# Patient Record
Sex: Female | Born: 2000 | Race: White | Hispanic: Yes | Marital: Single | Smoking: Never smoker
Health system: Southern US, Community
[De-identification: ages and names within clinical notes are randomized; demographics above are authoritative.]

---
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
## 2022-01-14 ENCOUNTER — Emergency Department (HOSPITAL_COMMUNITY)
Admission: EM | Admit: 2022-01-14 | Discharge: 2022-01-14 | Disposition: A | Discharge status: Home / Self Care | Payer: Medicaid Other | Attending: Emergency Medicine | Admitting: Emergency Medicine

## 2022-01-14 ENCOUNTER — Other Ambulatory Visit: Payer: Self-pay

## 2022-01-14 ENCOUNTER — Emergency Department (HOSPITAL_COMMUNITY): Payer: Medicaid Other

## 2022-01-14 DIAGNOSIS — F410 Panic disorder [episodic paroxysmal anxiety] without agoraphobia: Secondary | ICD-10-CM | POA: Diagnosis not present

## 2022-01-14 DIAGNOSIS — R0602 Shortness of breath: Secondary | ICD-10-CM | POA: Diagnosis not present

## 2022-01-14 DIAGNOSIS — R0789 Other chest pain: Secondary | ICD-10-CM | POA: Diagnosis not present

## 2022-01-14 DIAGNOSIS — F419 Anxiety disorder, unspecified: Secondary | ICD-10-CM | POA: Diagnosis present

## 2022-01-14 NOTE — Discharge Instructions (Signed)
Talk to your primary care doctor about medications that can limit the frequency and severity of panic attacks.  Avoid alcohol use.  Get plenty of sleep.  Minimize stressors.  Return to the emergency department for any new or worsening symptoms of concern.

## 2022-01-14 NOTE — ED Triage Notes (Signed)
Pt reports " I am having another anxiety attack and need space with nothing touching me, I get real sob and my chest gets tight for a month now. I have been working extra this past month"  Denies cp and sob at this time  Pt eyes are red and says she has been crying.

## 2022-01-14 NOTE — ED Provider Notes (Signed)
[facility] DEPT Provider Note   CSN: [id number] Arrival date & time: 01/14/22  0901     History  Chief Complaint  Patient presents with   Anxiety    Lynn Ruiz is a 21 y.o. female.   Anxiety Associated symptoms include chest pain and shortness of breath.  Patient presents for intermittent chest pain and shortness of breath.  She has no known chronic medical conditions.  She describes a "bacteria in her chest" that occurred 3 months ago, for which she completed antibiotics.  She describes a central chest pain that she experienced during that time.  Over the past several months, she has had intermittent symptoms where she feels central chest pain, shortness of breath, throat tightness, and anxiousness.  This occurs approximately once every 2 weeks.  She did have an episode this morning.  When these episodes occur, they typically last for 30 minutes.  Patient states that she will go for a walk to help calm down.  Once episode resolves, she is asymptomatic.  Currently, she denies any symptoms.     Home Medications Prior to Admission medications   Medication Sig Start Date End Date Taking? Authorizing Provider  AZASITE 1 % ophthalmic solution Place 1 drop into the left eye 2 (two) times daily. 03/28/19   [provider]  benzonatate (TESSALON) 100 MG capsule Take 1 capsule (100 mg total) by mouth every 8 (eight) hours. 04/15/21   Henderly, Britni A, PA-C  clotrimazole-betamethasone (LOTRISONE) cream Apply 1 application topically 2 (two) times daily. 08/22/19   Landis Martins, DPM  Eyelid Cleansers (STERILID) FOAM Apply 1 a small amount to skin twice a day  Scrub eyelids  with foam , then rinse 03/28/19   [provider]  ibuprofen (ADVIL) 400 MG tablet Take 1 tablet (400 mg total) by mouth every 6 (six) hours as needed. 07/10/18   Little, Wenda Overland, MD  omeprazole (PRILOSEC) 20 MG capsule Take 20 mg by mouth daily.    [provider]  sucralfate (CARAFATE) 1 g tablet Take 1 tablet (1 g total) by mouth 4 (four) times daily. 05/30/21   Lacretia Leigh, MD  Terbinafine HCl 1 % SOLN Spray in between toes at bedtime 08/22/19   Landis Martins, DPM      Allergies    Patient has no known allergies.    Review of Systems   Review of Systems  Respiratory:  Positive for shortness of breath.   Cardiovascular:  Positive for chest pain.  Psychiatric/Behavioral:  The patient is nervous/anxious.   All other systems reviewed and are negative.   Physical Exam Updated Vital Signs BP 127/64   Pulse 73   Temp 97.9 F (36.6 C) (Oral)   Resp 18   Ht 5\' 3"  (1.6 m)   LMP 12/24/2021   SpO2 99%   BMI 35.43 kg/m  Physical Exam Vitals and nursing note reviewed.  Constitutional:      General: She is not in acute distress.    Appearance: Normal appearance. She is well-developed. She is not ill-appearing, toxic-appearing or diaphoretic.  HENT:     Head: Normocephalic and atraumatic.     Right Ear: External ear normal.     Left Ear: External ear normal.     Nose: Nose normal.     Mouth/Throat:     Mouth: Mucous membranes are moist.     Pharynx: Oropharynx is clear.  Eyes:     Extraocular Movements: Extraocular movements intact.  Conjunctiva/sclera: Conjunctivae normal.  Cardiovascular:     Rate and Rhythm: Normal rate and regular rhythm.     Heart sounds: No murmur heard.    No friction rub. No gallop.  Pulmonary:     Effort: Pulmonary effort is normal. No respiratory distress.     Breath sounds: Normal breath sounds. No wheezing or rales.  Chest:     Chest wall: No tenderness.  Abdominal:     General: There is no distension.     Palpations: Abdomen is soft.     Tenderness: There is no abdominal tenderness.  Musculoskeletal:        General: No swelling. Normal range of motion.     Cervical back: Normal range of motion and neck supple.     Right lower leg: No edema.     Left lower leg: No edema.  Skin:     General: Skin is warm and dry.     Capillary Refill: Capillary refill takes less than 2 seconds.     Coloration: Skin is not jaundiced or pale.  Neurological:     General: No focal deficit present.     Mental Status: She is alert and oriented to person, place, and time.     Cranial Nerves: No cranial nerve deficit.     Sensory: No sensory deficit.     Motor: No weakness.     Coordination: Coordination normal.  Psychiatric:        Mood and Affect: Mood normal.        Behavior: Behavior normal.        Thought Content: Thought content normal.        Judgment: Judgment normal.     ED Results / Procedures / Treatments   Labs (all labs ordered are listed, but only abnormal results are displayed) Labs Reviewed - No data to display  EKG None  Radiology DG Chest 2 View  Result Date: 01/14/2022 CLINICAL DATA:  SOB, hx of pna EXAM: CHEST - 2 VIEW COMPARISON:  Chest x-ray May 30, 2021. FINDINGS: The heart size and mediastinal contours are within normal limits. Both lungs are clear. No visible pleural effusions or pneumothorax. No acute osseous abnormality. IMPRESSION: No active cardiopulmonary disease. Electronically Signed   By: Margaretha Sheffield M.D.   On: 01/14/2022 10:54    Procedures Procedures    Medications Ordered in ED Medications - No data to display  ED Course/ Medical Decision Making/ A&P                           Medical Decision Making  Patient is a healthy 21 year old female presenting for transient episodes of chest pain, shortness of breath, throat tightness, and anxiousness.  She had 1 such episode this morning.  Prior to today, her last episode was 2 weeks ago.  When these occur, she goes for a walk to calm down.  They typically last for 30 minutes.  On arrival in the ED, vital signs are normal and patient is well-appearing.  Prior to being bedded in the ED, patient underwent chest x-ray, the results of which were unremarkable.  On exam, patient's breathing is  unlabored.  Heart rate is normal and there are no cardiac rubs or murmurs on auscultation.  Lungs are also clear to auscultation.  Patient describes symptoms consistent with panic attacks.  She denies any alcohol use.  She is interested in starting a medication to help reduce the frequency.  She was  advised to speak with her primary care doctor about this.  In terms of this previous infection she describes, I do not see any record of that in EMR.  Given the short duration of the symptoms and complete resolution, in addition to a normal chest x-ray, I do not suspect current infection or recurrence of infection.  Patient was given reassurance.  She was discharged in good condition.        Final Clinical Impression(s) / ED Diagnoses Final diagnoses:  Panic attack    Rx / DC Orders ED Discharge Orders     None         Gloris Manchester, MD 01/14/22 1141

## 2022-01-14 NOTE — ED Provider Triage Note (Signed)
Emergency Medicine Provider Triage Evaluation Note  Lynn Ruiz , a 21 y.o. female  was evaluated in triage.  Pt complains of having an anxiety attack.  Pt reports she recently had a bacterial infection in her lungs.   Review of Systems  Positive: Cough anxiety Negative: fever  Physical Exam  BP 127/64   Pulse 73   Temp 97.9 F (36.6 C) (Oral)   Resp 18   Ht 5\' 3"  (1.6 m)   LMP 12/24/2021   SpO2 99%   BMI 35.43 kg/m  Gen:   Awake, no distress   Resp:  Normal effort  MSK:   Moves extremities without difficulty  Other:    Medical Decision Making  Medically screening exam initiated at 9:35 AM.  Appropriate orders placed.  Camila Maita was informed that the remainder of the evaluation will be completed by another provider, this initial triage assessment does not replace that evaluation, and the importance of remaining in the ED until their evaluation is complete.     Foye Deer, Elson Areas 01/14/22 [phone]

## 2022-01-19 ENCOUNTER — Emergency Department (HOSPITAL_COMMUNITY)
Admission: EM | Admit: 2022-01-19 | Discharge: 2022-01-20 | Disposition: A | Discharge status: Home / Self Care | Payer: Medicaid Other | Attending: Emergency Medicine | Admitting: Emergency Medicine

## 2022-01-19 ENCOUNTER — Other Ambulatory Visit: Payer: Self-pay

## 2022-01-19 ENCOUNTER — Emergency Department (HOSPITAL_COMMUNITY): Payer: Medicaid Other

## 2022-01-19 DIAGNOSIS — S39012A Strain of muscle, fascia and tendon of lower back, initial encounter: Secondary | ICD-10-CM | POA: Insufficient documentation

## 2022-01-19 DIAGNOSIS — Y92410 Unspecified street and highway as the place of occurrence of the external cause: Secondary | ICD-10-CM | POA: Diagnosis not present

## 2022-01-19 DIAGNOSIS — S161XXA Strain of muscle, fascia and tendon at neck level, initial encounter: Secondary | ICD-10-CM | POA: Insufficient documentation

## 2022-01-19 DIAGNOSIS — Z743 Need for continuous supervision: Secondary | ICD-10-CM | POA: Diagnosis not present

## 2022-01-19 DIAGNOSIS — T1490XA Injury, unspecified, initial encounter: Secondary | ICD-10-CM | POA: Diagnosis not present

## 2022-01-19 DIAGNOSIS — R531 Weakness: Secondary | ICD-10-CM | POA: Diagnosis not present

## 2022-01-19 DIAGNOSIS — R079 Chest pain, unspecified: Secondary | ICD-10-CM | POA: Diagnosis not present

## 2022-01-19 DIAGNOSIS — I959 Hypotension, unspecified: Secondary | ICD-10-CM | POA: Diagnosis not present

## 2022-01-19 DIAGNOSIS — S199XXA Unspecified injury of neck, initial encounter: Secondary | ICD-10-CM | POA: Diagnosis present

## 2022-01-19 LAB — CBC
HCT: 40.1 % (ref 36.0–46.0)
Hemoglobin: 12.8 g/dL (ref 12.0–15.0)
MCH: 27.5 pg (ref 26.0–34.0)
MCHC: 31.9 g/dL (ref 30.0–36.0)
MCV: 86.1 fL (ref 80.0–100.0)
Platelets: 312 10*3/uL (ref 150–400)
RBC: 4.66 MIL/uL (ref 3.87–5.11)
RDW: 13.5 % (ref 11.5–15.5)
WBC: 8.4 10*3/uL (ref 4.0–10.5)
nRBC: 0 % (ref 0.0–0.2)

## 2022-01-19 LAB — URINALYSIS, ROUTINE W REFLEX MICROSCOPIC
Bilirubin Urine: NEGATIVE
Glucose, UA: NEGATIVE mg/dL
Ketones, ur: NEGATIVE mg/dL
Leukocytes,Ua: NEGATIVE
Nitrite: NEGATIVE
Protein, ur: NEGATIVE mg/dL
Specific Gravity, Urine: 1.025 (ref 1.005–1.030)
pH: 6.5 (ref 5.0–8.0)

## 2022-01-19 LAB — COMPREHENSIVE METABOLIC PANEL
ALT: 66 U/L — ABNORMAL HIGH (ref 0–44)
AST: 42 U/L — ABNORMAL HIGH (ref 15–41)
Albumin: 4.1 g/dL (ref 3.5–5.0)
Alkaline Phosphatase: 59 U/L (ref 38–126)
Anion gap: 11 (ref 5–15)
BUN: 11 mg/dL (ref 6–20)
CO2: 23 mmol/L (ref 22–32)
Calcium: 9.2 mg/dL (ref 8.9–10.3)
Chloride: 105 mmol/L (ref 98–111)
Creatinine, Ser: 0.66 mg/dL (ref 0.44–1.00)
GFR, Estimated: >60 mL/min (ref >60)
Glucose, Bld: 94 mg/dL (ref 70–99)
Potassium: 4.3 mmol/L (ref 3.5–5.1)
Sodium: 139 mmol/L (ref 135–145)
Total Bilirubin: 0.4 mg/dL (ref 0.3–1.2)
Total Protein: 7.5 g/dL (ref 6.5–8.1)

## 2022-01-19 LAB — URINALYSIS, MICROSCOPIC (REFLEX)

## 2022-01-19 LAB — I-STAT CHEM 8, ED
BUN: 12 mg/dL (ref 6–20)
Calcium, Ion: 1.18 mmol/L (ref 1.15–1.40)
Chloride: 105 mmol/L (ref 98–111)
Creatinine, Ser: 0.6 mg/dL (ref 0.44–1.00)
Glucose, Bld: 97 mg/dL (ref 70–99)
HCT: 38 % (ref 36.0–46.0)
Hemoglobin: 12.9 g/dL (ref 12.0–15.0)
Potassium: 3.9 mmol/L (ref 3.5–5.1)
Sodium: 140 mmol/L (ref 135–145)
TCO2: 24 mmol/L (ref 22–32)

## 2022-01-19 LAB — I-STAT BETA HCG BLOOD, ED (MC, WL, AP ONLY): I-stat hCG, quantitative: <5 m[IU]/mL (ref <5)

## 2022-01-19 MED ORDER — HYDROCODONE-ACETAMINOPHEN 5-325 MG PO TABS
1.0000 | ORAL_TABLET | Freq: Once | ORAL | Status: AC
  Administered 2022-01-19: 1 via ORAL
  Filled 2022-01-19: qty 1

## 2022-01-19 MED ORDER — ONDANSETRON 4 MG PO TBDP
4.0000 mg | ORAL_TABLET | Freq: Once | ORAL | Status: AC
  Administered 2022-01-19: 4 mg via ORAL
  Filled 2022-01-19: qty 1

## 2022-01-19 MED ORDER — IOHEXOL 350 MG/ML SOLN
75.0000 mL | Freq: Once | INTRAVENOUS | Status: AC | PRN
  Administered 2022-01-20: 75 mL via INTRAVENOUS
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
## 2022-01-20 DIAGNOSIS — F321 Major depressive disorder, single episode, moderate: Secondary | ICD-10-CM | POA: Diagnosis not present

## 2022-01-20 DIAGNOSIS — F39 Unspecified mood [affective] disorder: Secondary | ICD-10-CM | POA: Diagnosis not present

## 2022-01-20 DIAGNOSIS — R631 Polydipsia: Secondary | ICD-10-CM | POA: Diagnosis not present

## 2022-01-20 DIAGNOSIS — E559 Vitamin D deficiency, unspecified: Secondary | ICD-10-CM | POA: Diagnosis not present

## 2022-01-20 DIAGNOSIS — F411 Generalized anxiety disorder: Secondary | ICD-10-CM | POA: Diagnosis not present

## 2022-01-20 MED ORDER — LIDOCAINE 4 % EX PTCH: 1.0000 | MEDICATED_PATCH | CUTANEOUS | 0 refills | Status: AC

## 2022-01-20 MED ORDER — KETOROLAC TROMETHAMINE 60 MG/2ML IM SOLN
30.0000 mg | Freq: Once | INTRAMUSCULAR | Status: AC
  Administered 2022-01-20: 30 mg via INTRAMUSCULAR
  Filled 2022-01-20: qty 2
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
## 2022-03-05 ENCOUNTER — Other Ambulatory Visit: Payer: Self-pay

## 2022-03-05 ENCOUNTER — Emergency Department (HOSPITAL_COMMUNITY): Admission: EM | Admit: 2022-03-05 | Payer: No Typology Code available for payment source | Source: Home / Self Care

## 2022-03-05 ENCOUNTER — Encounter (HOSPITAL_COMMUNITY): Payer: Self-pay

## 2022-03-05 ENCOUNTER — Emergency Department (HOSPITAL_COMMUNITY): Payer: Medicaid Other

## 2022-03-05 ENCOUNTER — Emergency Department (HOSPITAL_COMMUNITY)
Admission: EM | Admit: 2022-03-05 | Discharge: 2022-03-05 | Disposition: A | Discharge status: Home / Self Care | Payer: Medicaid Other | Attending: Emergency Medicine | Admitting: Emergency Medicine

## 2022-03-05 DIAGNOSIS — K5900 Constipation, unspecified: Secondary | ICD-10-CM

## 2022-03-05 DIAGNOSIS — K625 Hemorrhage of anus and rectum: Secondary | ICD-10-CM

## 2022-03-05 DIAGNOSIS — N9489 Other specified conditions associated with female genital organs and menstrual cycle: Secondary | ICD-10-CM | POA: Diagnosis not present

## 2022-03-05 DIAGNOSIS — R109 Unspecified abdominal pain: Secondary | ICD-10-CM | POA: Diagnosis not present

## 2022-03-05 DIAGNOSIS — K649 Unspecified hemorrhoids: Secondary | ICD-10-CM

## 2022-03-05 LAB — URINALYSIS, ROUTINE W REFLEX MICROSCOPIC
Bilirubin Urine: NEGATIVE
Glucose, UA: NEGATIVE mg/dL
Ketones, ur: NEGATIVE mg/dL
Nitrite: NEGATIVE
Protein, ur: NEGATIVE mg/dL
Specific Gravity, Urine: 1.024 (ref 1.005–1.030)
pH: 5 (ref 5.0–8.0)

## 2022-03-05 LAB — LIPASE, BLOOD: Lipase: 31 U/L (ref 11–51)

## 2022-03-05 LAB — I-STAT BETA HCG BLOOD, ED (MC, WL, AP ONLY): I-stat hCG, quantitative: <5 m[IU]/mL (ref <5)

## 2022-03-05 LAB — COMPREHENSIVE METABOLIC PANEL
ALT: 76 U/L — ABNORMAL HIGH (ref 0–44)
AST: 37 U/L (ref 15–41)
Albumin: 4 g/dL (ref 3.5–5.0)
Alkaline Phosphatase: 59 U/L (ref 38–126)
Anion gap: 8 (ref 5–15)
BUN: 13 mg/dL (ref 6–20)
CO2: 26 mmol/L (ref 22–32)
Calcium: 8.9 mg/dL (ref 8.9–10.3)
Chloride: 104 mmol/L (ref 98–111)
Creatinine, Ser: 0.61 mg/dL (ref 0.44–1.00)
GFR, Estimated: >60 mL/min (ref >60)
Glucose, Bld: 104 mg/dL — ABNORMAL HIGH (ref 70–99)
Potassium: 3.6 mmol/L (ref 3.5–5.1)
Sodium: 138 mmol/L (ref 135–145)
Total Bilirubin: 0.3 mg/dL (ref 0.3–1.2)
Total Protein: 7.9 g/dL (ref 6.5–8.1)

## 2022-03-05 LAB — CBC
HCT: 40.8 % (ref 36.0–46.0)
Hemoglobin: 12.9 g/dL (ref 12.0–15.0)
MCH: 27.2 pg (ref 26.0–34.0)
MCHC: 31.6 g/dL (ref 30.0–36.0)
MCV: 86.1 fL (ref 80.0–100.0)
Platelets: 304 10*3/uL (ref 150–400)
RBC: 4.74 MIL/uL (ref 3.87–5.11)
RDW: 13.5 % (ref 11.5–15.5)
WBC: 7.2 10*3/uL (ref 4.0–10.5)
nRBC: 0 % (ref 0.0–0.2)

## 2022-03-05 LAB — POC OCCULT BLOOD, ED: Fecal Occult Bld: POSITIVE — AB

## 2022-03-05 LAB — TYPE AND SCREEN
ABO/RH(D): O POS
Antibody Screen: NEGATIVE

## 2022-03-05 MED ORDER — POLYETHYLENE GLYCOL 3350 17 G PO PACK
17.0000 g | PACK | Freq: Every day | ORAL | Status: DC
  Administered 2022-03-05: 17 g via ORAL
  Filled 2022-03-05: qty 1

## 2022-03-05 MED ORDER — POLYETHYLENE GLYCOL 3350 17 G PO PACK: 17.0000 g | PACK | Freq: Every day | ORAL | 0 refills | Status: AC

## 2022-03-05 NOTE — ED Notes (Signed)
Per Jerene Pitch, [place], the patient is to be discharged and informed to follow up with primary care doctor regarding her toes.
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
## 2022-03-05 NOTE — ED Notes (Signed)
Pt A&OX4 ambulatory with independent steady gait. Pt verbalized understanding of d/c instructions, prescription and follow up care. 

## 2022-03-05 NOTE — ED Notes (Signed)
Pt reports she has infected toe nails from a previous visit as a nail salon. She is requesting the PA to assess them to see if she needs medication. Brooke, [place] informed and at bedside.
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
## 2022-03-05 NOTE — Discharge Instructions (Signed)
Please follow-up with your primary care doctor, please increase your fluids in your diet, and veggies.  If you have large amounts of blood in your stool, begin become dizzy, or have severe abdominal pain please return to the ER.  Please take MiraLAX daily for the next 7 days at least and then as needed for your constipation.

## 2022-03-06 ENCOUNTER — Telehealth: Payer: Self-pay | Admitting: Podiatry

## 2022-03-06 NOTE — Telephone Encounter (Signed)
Pt had scheduled an appt thru mychart as a new pt and I called and left message for pt that she is not a new pt with our office and I had to change the time of her appt to 945am on 1.23 and to call to confirm.Lynn KitchenMarland Ruiz

## 2022-03-17 ENCOUNTER — Ambulatory Visit (INDEPENDENT_AMBULATORY_CARE_PROVIDER_SITE_OTHER): Payer: Medicaid Other | Admitting: Podiatry

## 2022-03-17 ENCOUNTER — Encounter: Payer: Self-pay | Admitting: Podiatry

## 2022-03-17 VITALS — BP 100/66 | HR 80

## 2022-03-17 DIAGNOSIS — L600 Ingrowing nail: Secondary | ICD-10-CM

## 2022-03-17 NOTE — Patient Instructions (Signed)

## 2022-03-17 NOTE — Progress Notes (Signed)
  Subjective:  Patient ID: Lynn Ruiz, female    DOB: [birth year],   MRN: [id number]  Chief Complaint  Patient presents with   Ingrown Toenail    Patient is here for bilateral ingrown toe nail great toe, she states that they both hurt and she recently went to have  a pedicure done and she thinks that is what caused the infection in both great toes.    22 y.o. female presents for concern of bilateral ingrown nails. Relates she has been dealing with these for years. She has had them removed before but not permanently. Relates they keep getting reinfected from the salon. Relates pain. Deneis any treatments.  . Denies any other pedal complaints. Denies n/v/f/c.   History reviewed. No pertinent past medical history.  Objective:  Physical Exam: Vascular: DP/PT pulses 2/4 bilateral. CFT <3 seconds. Normal hair growth on digits. No edema.  Skin. No lacerations or abrasions bilateral feet. Bilateral hallux nails incurvated on bilateral borders. Erythema and edema and hypergranular tissue noted to lateral borders bilateral.  Musculoskeletal: MMT 5/5 bilateral lower extremities in DF, PF, Inversion and Eversion. Deceased ROM in DF of ankle joint.  Neurological: Sensation intact to light touch.   Assessment:   1. Ingrown left greater toenail   2. Ingrown right greater toenail      Plan:  Patient was evaluated and treated and all questions answered. Patient requesting removal of ingrown nail today. Procedure below.  Discussed procedure and post procedure care and patient expressed understanding.  Will follow-up in 2 weeks for nail check or sooner if any problems arise.    Procedure:  Procedure: partial Nail Avulsion of bilaterally hallux bilateral nail border.  Surgeon: Lorenda Peck, DPM  Pre-op Dx: Ingrown toenail with and without infection Post-op: Same  Place of Surgery: Office exam room.  Indications for surgery: Painful and ingrown toenail.    The patient is requesting  removal of nail with chemical matrixectomy. Risks and complications were discussed with the patient for which they understand and written consent was obtained. Under sterile conditions a total of 3 mL of  1% lidocaine plain was infiltrated in a hallux block fashion. Once anesthetized, the skin was prepped in sterile fashion. A tourniquet was then applied. Next the bilateral aspect of hallux nail border was then sharply excised making sure to remove the entire offending nail border.  Next phenol was then applied under standard conditions and copiously irrigated. Silvadene was applied. A dry sterile dressing was applied. After application of the dressing the tourniquet was removed and there is found to be an immediate capillary refill time to the digit. The patient tolerated the procedure well without any complications. Post procedure instructions were discussed the patient for which he verbally understood. Follow-up in two weeks for nail check or sooner if any problems are to arise. Discussed signs/symptoms of infection and directed to call the office immediately should any occur or go directly to the emergency room. In the meantime, encouraged to call the office with any questions, concerns, changes symptoms.   Lorenda Peck, DPM

## 2022-03-23 ENCOUNTER — Emergency Department (HOSPITAL_COMMUNITY): Payer: Medicaid Other

## 2022-03-23 ENCOUNTER — Other Ambulatory Visit: Payer: Self-pay

## 2022-03-23 ENCOUNTER — Emergency Department (HOSPITAL_COMMUNITY)
Admission: EM | Admit: 2022-03-23 | Discharge: 2022-03-23 | Disposition: A | Discharge status: Home / Self Care | Payer: Medicaid Other | Attending: Emergency Medicine | Admitting: Emergency Medicine

## 2022-03-23 DIAGNOSIS — Z1152 Encounter for screening for COVID-19: Secondary | ICD-10-CM | POA: Diagnosis not present

## 2022-03-23 DIAGNOSIS — R059 Cough, unspecified: Secondary | ICD-10-CM | POA: Diagnosis present

## 2022-03-23 DIAGNOSIS — J101 Influenza due to other identified influenza virus with other respiratory manifestations: Secondary | ICD-10-CM | POA: Insufficient documentation

## 2022-03-23 LAB — RESP PANEL BY RT-PCR (RSV, FLU A&B, COVID)  RVPGX2
Influenza A by PCR: POSITIVE — AB
Influenza B by PCR: NEGATIVE
Resp Syncytial Virus by PCR: NEGATIVE
SARS Coronavirus 2 by RT PCR: NEGATIVE

## 2022-03-23 MED ORDER — BENZONATATE 100 MG PO CAPS: 100.0000 mg | ORAL_CAPSULE | Freq: Three times a day (TID) | ORAL | 0 refills | Status: AC | PRN

## 2022-03-23 NOTE — ED Triage Notes (Signed)
Pt c/o coughing x 1 month and states fever last night with leg numbness and pain.

## 2022-03-23 NOTE — ED Provider Notes (Signed)
[facility] Provider Note   CSN: [id number] Arrival date & time: 03/23/22  1517     History  Chief Complaint  Patient presents with   Cough   Leg Pain    Lynn Ruiz is a 22 y.o. female with no documented medical history.  The patient presents to the ED for evaluation of flulike symptoms, cough, fever, leg pain.  Patient reports that for the last 1 month she has had a cough.  Patient reports that yesterday this cough worsened and she also developed a fever.  Patient also goes on to report that she had 1 episode where she had numbness in her legs bilaterally however this resolved and now they just "feel weak".  Patient denies back pain, denies dysuria, flank pain.  Patient denies trauma to her back.  Patient denies nausea, vomiting or diarrhea.  Patient denies chest pain or shortness of breath.   Cough Associated symptoms: chills, fever and myalgias   Associated symptoms: no chest pain, no shortness of breath and no sore throat   Leg Pain Associated symptoms: fever        Home Medications Prior to Admission medications   Medication Sig Start Date End Date Taking? Authorizing Provider  benzonatate (TESSALON) 100 MG capsule Take 1 capsule (100 mg total) by mouth every 8 (eight) hours as needed for cough. 03/23/22  Yes Azucena Cecil, PA-C  AZASITE 1 % ophthalmic solution Place 1 drop into the left eye 2 (two) times daily. 03/28/19   [provider]  clotrimazole-betamethasone (LOTRISONE) cream Apply 1 application topically 2 (two) times daily. 08/22/19   Landis Martins, DPM  Eyelid Cleansers (STERILID) FOAM Apply 1 a small amount to skin twice a day  Scrub eyelids  with foam , then rinse 03/28/19   [provider]  ibuprofen (ADVIL) 400 MG tablet Take 1 tablet (400 mg total) by mouth every 6 (six) hours as needed. 07/10/18   Little, Wenda Overland, MD  lidocaine (LIDOCAINE PAIN RELIEF) 4 % Place 1 patch onto the  skin daily. 01/20/22   Regan Lemming, MD  omeprazole (PRILOSEC) 20 MG capsule Take 20 mg by mouth daily.    [provider]  polyethylene glycol (MIRALAX) 17 g packet Take 17 g by mouth daily. 03/05/22   Small, Brooke L, PA  sucralfate (CARAFATE) 1 g tablet Take 1 tablet (1 g total) by mouth 4 (four) times daily. 05/30/21   Lacretia Leigh, MD  Terbinafine HCl 1 % SOLN Spray in between toes at bedtime 08/22/19   Landis Martins, DPM      Allergies    Patient has no known allergies.    Review of Systems   Review of Systems  Constitutional:  Positive for chills and fever.  HENT:  Negative for sore throat.   Respiratory:  Positive for cough. Negative for shortness of breath.   Cardiovascular:  Negative for chest pain.  Gastrointestinal:  Negative for diarrhea, nausea and vomiting.  Musculoskeletal:  Positive for myalgias.  All other systems reviewed and are negative.   Physical Exam Updated Vital Signs BP 121/82   Pulse 100   Temp 97.8 F (36.6 C) (Oral)   Resp 18   LMP 03/23/2022   SpO2 100%  Physical Exam Vitals and nursing note reviewed.  Constitutional:      General: She is not in acute distress.    Appearance: She is well-developed.  HENT:     Head: Normocephalic and atraumatic.  Mouth/Throat:     Mouth: Mucous membranes are moist.     Pharynx: Oropharynx is clear. Posterior oropharyngeal erythema present. No oropharyngeal exudate.  Eyes:     Conjunctiva/sclera: Conjunctivae normal.  Cardiovascular:     Rate and Rhythm: Normal rate and regular rhythm.     Heart sounds: No murmur heard. Pulmonary:     Effort: Pulmonary effort is normal. No respiratory distress.     Breath sounds: Normal breath sounds.  Abdominal:     Palpations: Abdomen is soft.     Tenderness: There is no abdominal tenderness.  Musculoskeletal:        General: No swelling.     Cervical back: Neck supple.  Skin:    General: Skin is warm and dry.     Capillary Refill: Capillary refill  takes less than 2 seconds.  Neurological:     General: No focal deficit present.     Mental Status: She is alert and oriented to person, place, and time.     GCS: GCS eye subscore is 4. GCS verbal subscore is 5. GCS motor subscore is 6.     Cranial Nerves: Cranial nerves 2-12 are intact. No cranial nerve deficit.     Sensory: Sensation is intact. No sensory deficit.     Motor: Motor function is intact. No weakness.     Coordination: Coordination is intact. Heel to [facility] Test normal.     Gait: Gait is intact. Gait normal.     Comments: 5 out of 5 strength bilateral lower extremities.  Patient shows steady gait.  Psychiatric:        Mood and Affect: Mood normal.     ED Results / Procedures / Treatments   Labs (all labs ordered are listed, but only abnormal results are displayed) Labs Reviewed  RESP PANEL BY RT-PCR (RSV, FLU A&B, COVID)  RVPGX2 - Abnormal; Notable for the following components:      Result Value   Influenza A by PCR POSITIVE (*)    All other components within normal limits    EKG None  Radiology DG Chest 2 View  Result Date: 03/23/2022 CLINICAL DATA:  Cough for 1 month, worsening yesterday. EXAM: CHEST - 2 VIEW COMPARISON:  January 14, 2022 FINDINGS: The heart size and mediastinal contours are within normal limits. Both lungs are clear. The visualized skeletal structures are unremarkable. IMPRESSION: No active cardiopulmonary disease. Electronically Signed   By: Sherian Rein M.D.   On: 03/23/2022 18:15    Procedures Procedures    Medications Ordered in ED Medications - No data to display  ED Course/ Medical Decision Making/ A&P  Medical Decision Making  22 year old female presents to ED for evaluation of URI symptoms.  Please see HPI for further details.  On examination the patient is afebrile, nontachycardic.  Patient lung sounds clear bilaterally, she is not hypoxic on room air.  Patient abdomen soft and compressible throughout.  Posterior oropharynx is  erythematous without exudate.  Uvula midline, handling secretions appropriately.  Viral testing positive for influenza A.  Chest x-ray shows no consolidations or effusions.  Patient shows steady gait.  Normal neurological examination.  5 out of 5 strength bilateral lower extremities.  Unfortunately patient outside of window for antiviral medication initiation.  Patient will be sent home with Jerilynn Som, advised to follow-up with PCP.  Patient provided return precautions and she voiced understanding.  Patient advised to treat symptoms at home conservatively.  Patient will be written out of work.  Patient discharged  in stable condition.   Final Clinical Impression(s) / ED Diagnoses Final diagnoses:  Influenza A    Rx / DC Orders ED Discharge Orders          Ordered    benzonatate (TESSALON) 100 MG capsule  Every 8 hours PRN        03/23/22 2046              Lawana Chambers 03/23/22 2047    Fransico Meadow, MD 03/26/22 [phone]

## 2022-03-23 NOTE — ED Provider Triage Note (Signed)
Emergency Medicine Provider Triage Evaluation Note  Hyacinth Marcelli , a 22 y.o. female  was evaluated in triage.  Pt complains of multiple symptoms.  She reports having a cough for approximately 1 month.  She denies associated fevers, runny nose, sore throat.  She denies history of asthma, allergy symptoms, GERD.  She is not on any chronic medications.  Yesterday she developed more of a runny nose and worsening cough.  She had a fever at home.  She also reports an episode where she had numbness in her legs bilaterally.  This has resolved but now her legs just "feel weak".  No associated back pain.  No urinary symptoms.  Review of Systems  Positive: Cough, fever Negative: Back pain  Physical Exam  BP 121/82   Pulse 100   Temp 97.8 F (36.6 C) (Oral)   Resp 18   SpO2 100%  Gen:   Awake, no distress   Resp:  Normal effort  MSK:   Moves extremities without difficulty  Other:    Medical Decision Making  Medically screening exam initiated at 3:31 PM.  Appropriate orders placed.  Marcheta Horsey was informed that the remainder of the evaluation will be completed by another provider, this initial triage assessment does not replace that evaluation, and the importance of remaining in the ED until their evaluation is complete.     Carlisle Cater, PA-C 03/23/22 1533

## 2022-03-23 NOTE — ED Notes (Signed)
Pt advises being unable to provide urine sample at this time, will monitor.

## 2022-03-23 NOTE — Discharge Instructions (Addendum)
Return to the ED with any new or worsening signs or symptoms Please follow-up with PCP for ongoing management Please see work note Please read attached guide concerning influenza Please fill prescription for anticough medication Please treat symptoms conservatively.  You will may take ibuprofen for body aches and chills, Tylenol for headaches and fevers.  You may take Zicam to help shorten duration of symptoms.  Please eat low-fat high-protein diet.  Please push fluids to include Gatorade, body armor or Pedialyte

## 2022-03-30 ENCOUNTER — Ambulatory Visit: Payer: Medicaid Other | Admitting: Podiatry

## 2022-10-05 ENCOUNTER — Ambulatory Visit (INDEPENDENT_AMBULATORY_CARE_PROVIDER_SITE_OTHER): Payer: BLUE CROSS/BLUE SHIELD | Admitting: Podiatry

## 2022-10-05 DIAGNOSIS — Z91199 Patient's noncompliance with other medical treatment and regimen due to unspecified reason: Secondary | ICD-10-CM

## 2022-10-05 NOTE — Progress Notes (Signed)
No show

## 2022-10-07 ENCOUNTER — Telehealth: Payer: Self-pay | Admitting: Podiatry

## 2022-10-07 NOTE — Telephone Encounter (Signed)
Called pt and left message to let her know that her insurance is out of network.

## 2022-10-13 ENCOUNTER — Ambulatory Visit (INDEPENDENT_AMBULATORY_CARE_PROVIDER_SITE_OTHER): Payer: BLUE CROSS/BLUE SHIELD | Admitting: Podiatry

## 2022-10-13 DIAGNOSIS — Z91199 Patient's noncompliance with other medical treatment and regimen due to unspecified reason: Secondary | ICD-10-CM

## 2022-10-13 NOTE — Progress Notes (Signed)
No show

## 2023-06-08 IMAGING — US US ABDOMEN LIMITED
1 series · 14 of 25 positions shown · non-contrast
Comparison: None.

CLINICAL DATA: Right upper quadrant pain

EXAM:
ULTRASOUND ABDOMEN LIMITED RIGHT UPPER QUADRANT

[Series 1: us abdomen limited · 14 of 50 slices shown]
[im 1/50]
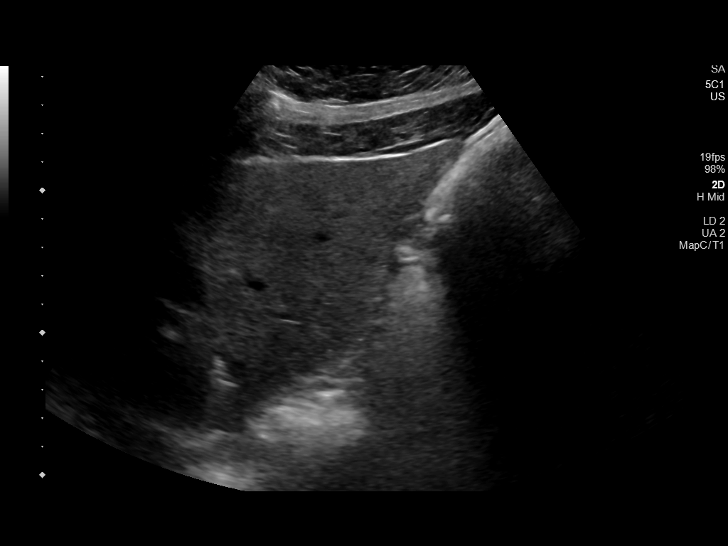
[im 5/50]
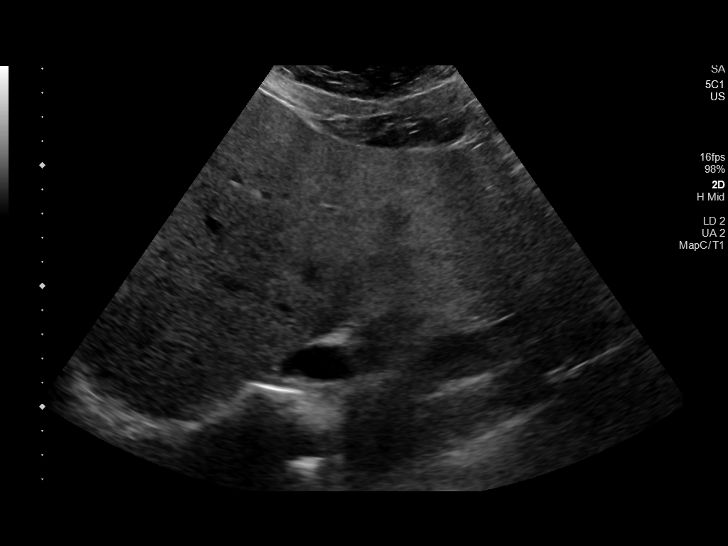
[im 9/50]
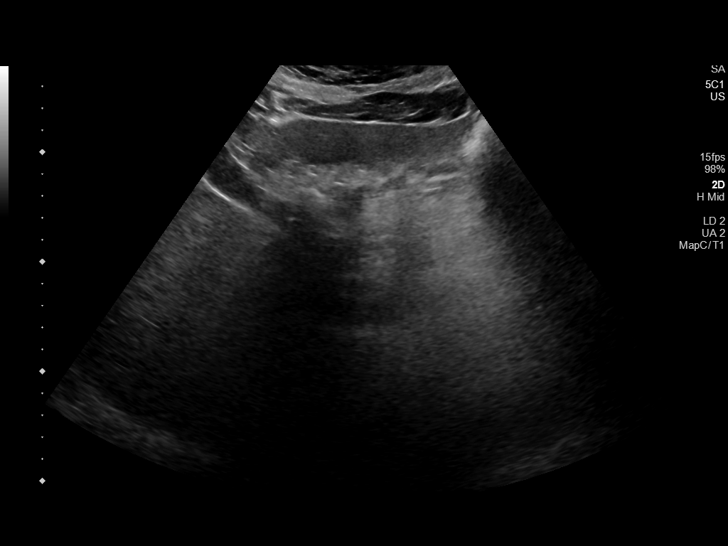
[im 13/50]
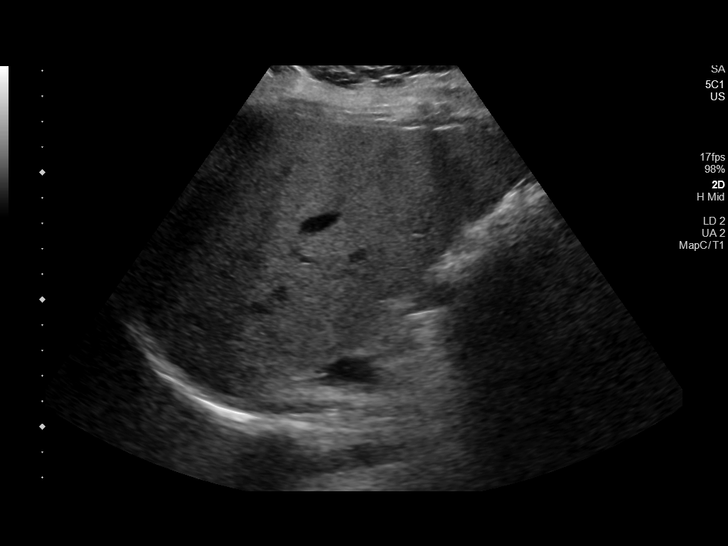
[im 17/50]
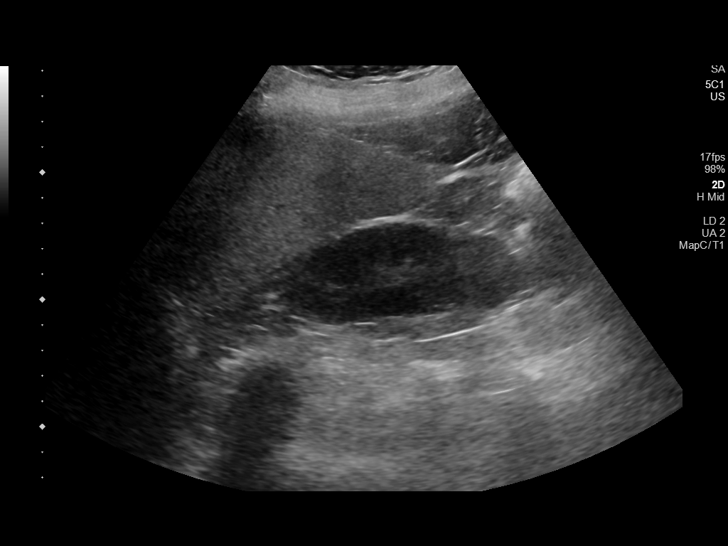
[im 19/50]
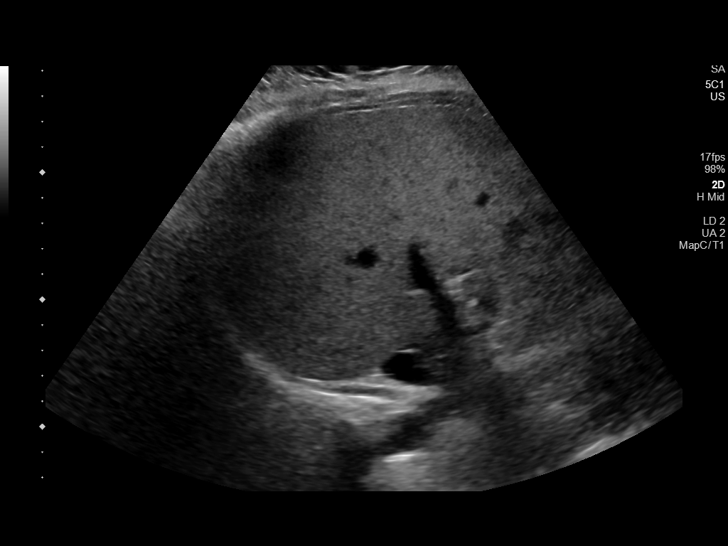
[im 23/50]
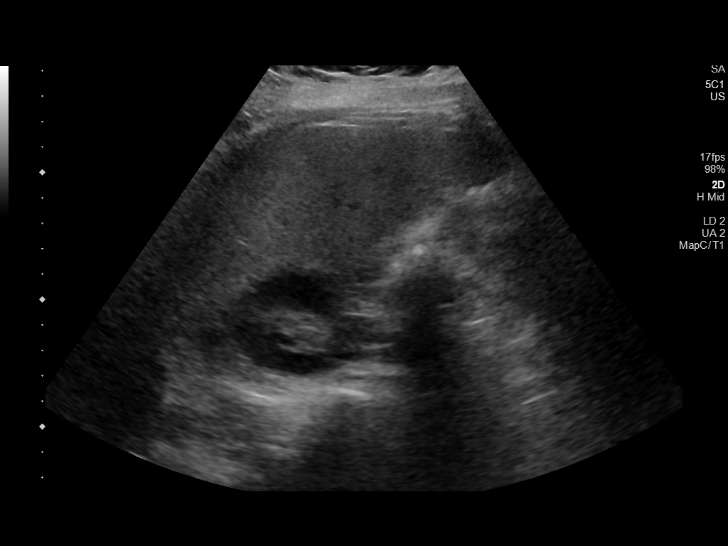
[im 27/50]
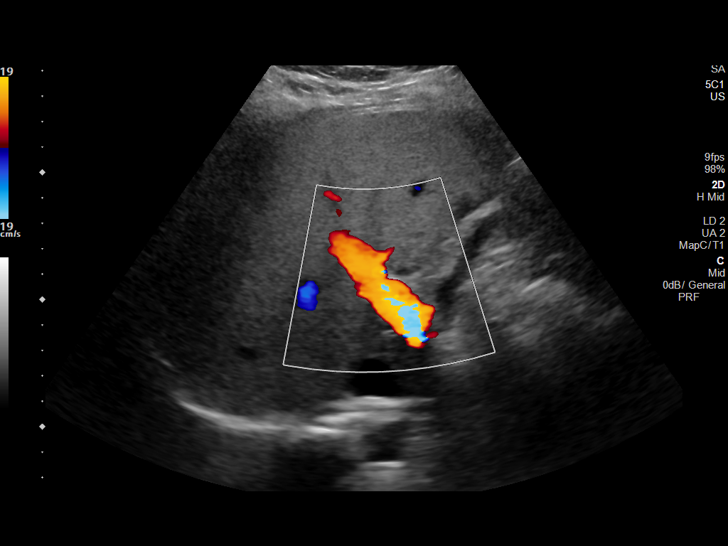
[im 31/50]
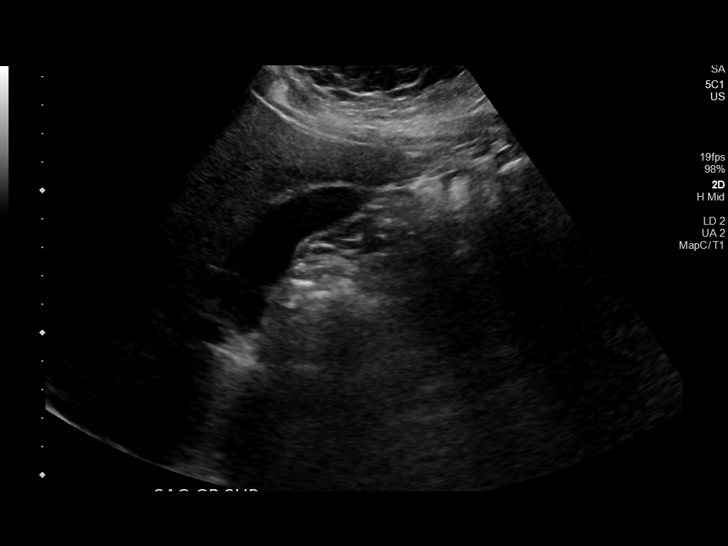
[im 33/50]
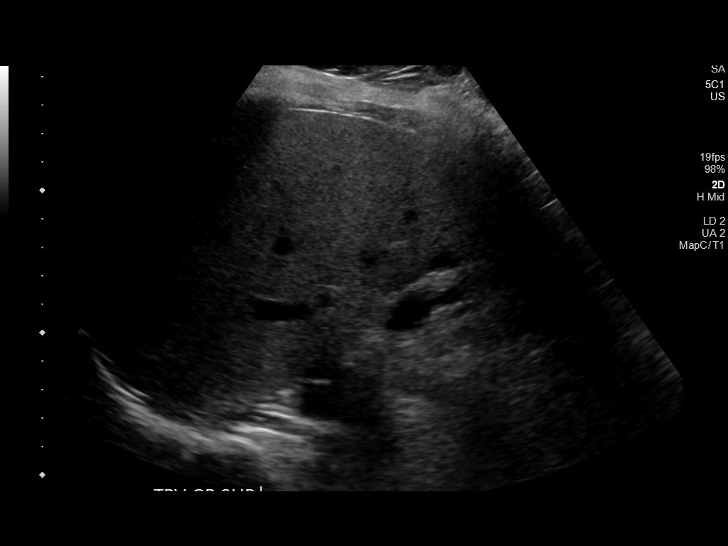
[im 37/50]
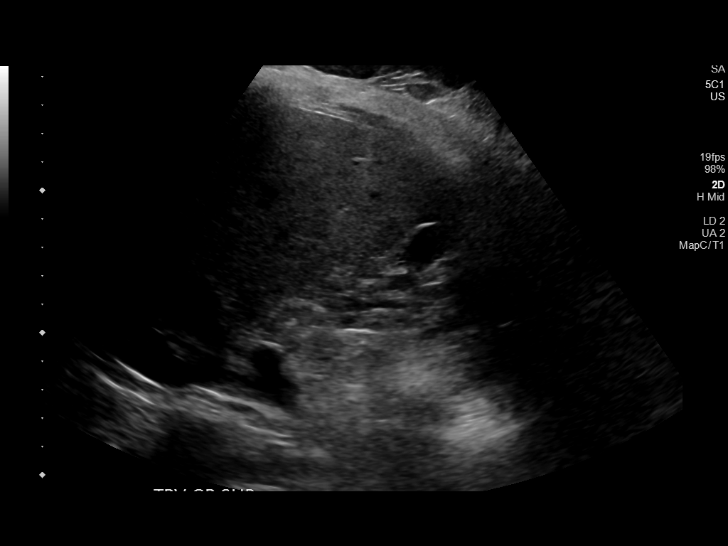
[im 41/50]
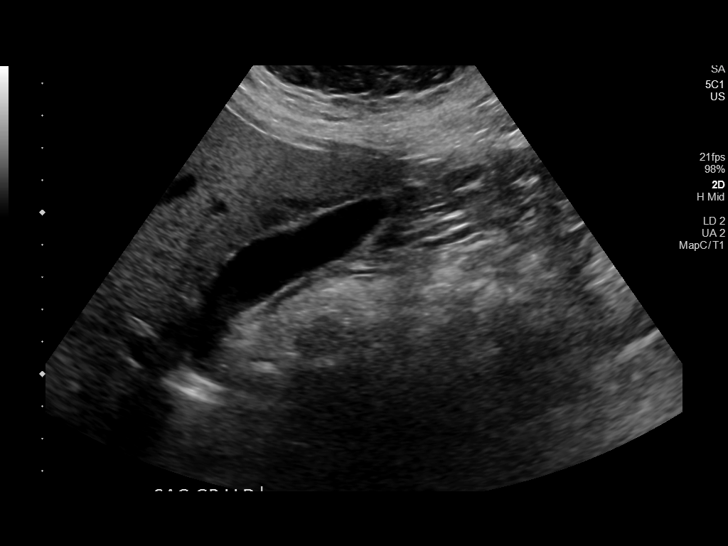
[im 45/50]
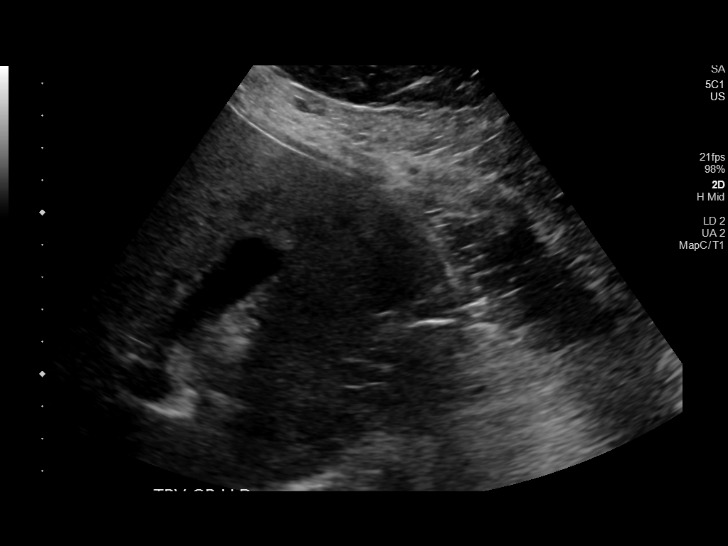
[im 50/50]
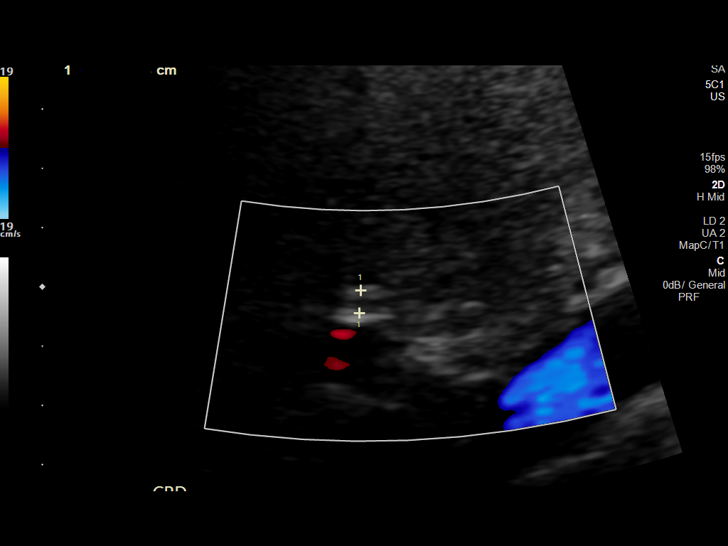

[14 of 25 positions shown; findings below may reference images not displayed]

FINDINGS: Gallbladder:

No gallstones or wall thickening visualized. No sonographic Murphy
sign noted by sonographer.

Common bile duct:

Diameter: 3.8 mm

Liver:

Mildly increased in echogenicity consistent with fatty infiltration.
Focal fatty sparing is noted adjacent to the gallbladder fossa.
Portal vein is patent on color Doppler imaging with normal direction
of blood flow towards the liver.

Other: None.
IMPRESSION: Fatty liver.

No other focal abnormality is noted.
